# Patient Record
Sex: Female | Born: 1965 | Race: Black or African American | Marital: Single
Health system: Southern US, Community
[De-identification: ages and names within clinical notes are randomized; demographics above are authoritative.]

---

## 2013-11-07 ENCOUNTER — Other Ambulatory Visit: Payer: Self-pay | Admitting: Sports Medicine

## 2013-11-07 DIAGNOSIS — M25512 Pain in left shoulder: Secondary | ICD-10-CM

## 2013-11-12 ENCOUNTER — Ambulatory Visit
Admission: RE | Admit: 2013-11-12 | Discharge: 2013-11-12 | Disposition: A | Discharge status: Home / Self Care | Payer: Managed Care, Other (non HMO) | Source: Ambulatory Visit | Attending: Sports Medicine | Admitting: Sports Medicine

## 2013-11-12 DIAGNOSIS — M25512 Pain in left shoulder: Secondary | ICD-10-CM

## 2016-02-06 IMAGING — MR MR SHOULDER*L* W/O CM
5 series · 34 of 40 positions shown · non-contrast
Comparison: None.

CLINICAL DATA: Chronic left shoulder pain with very limited range
of motion. No recent injury or prior relevant surgery.

EXAM:
MRI OF THE LEFT SHOULDER WITHOUT CONTRAST
TECHNIQUE: Multiplanar, multisequence MR imaging of the shoulder was performed.
No intravenous contrast was administered.

[Series 3: T2 fat-sat · axial · 4.0mm · 0.55mm/px · z∈[-48,+36]mm · 8 of 20 slices shown (1 of 3)]
[im 1/20]
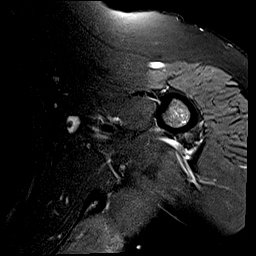
[im 3/20]
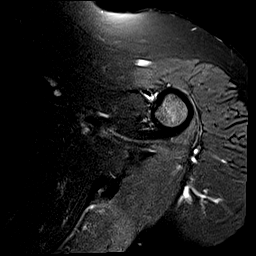
[im 6/20]
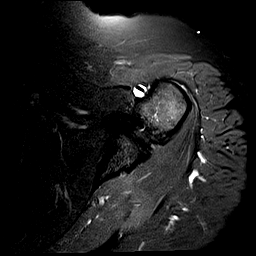
[im 9/20]
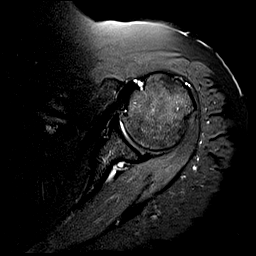
[im 11/20]
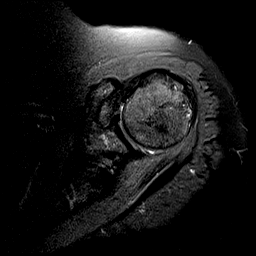
[im 14/20]
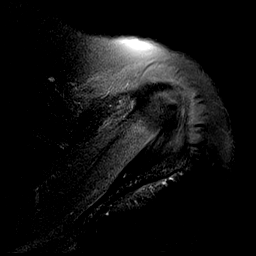
[im 17/20]
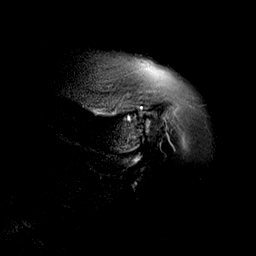
[im 20/20]
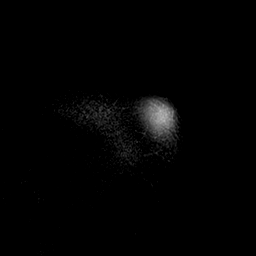

[Series 4: T2 fat-sat · oblique · 4.0mm · 0.55mm/px · 8 of 19 slices shown (2 of 3)]
[im 1/19]
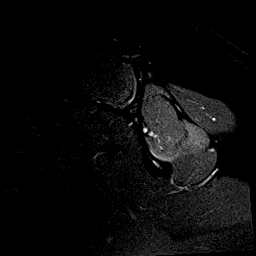
[im 3/19]
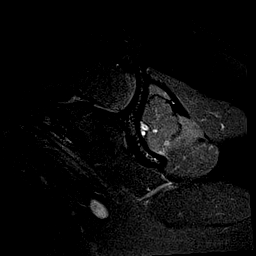
[im 6/19]
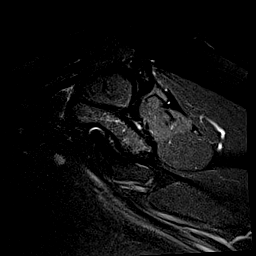
[im 8/19]
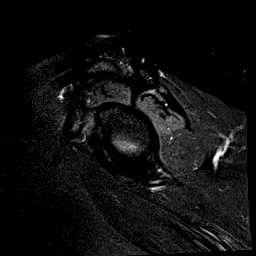
[im 11/19]
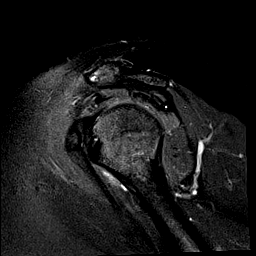
[im 13/19]
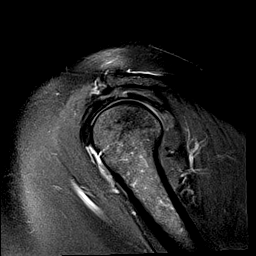
[im 16/19]
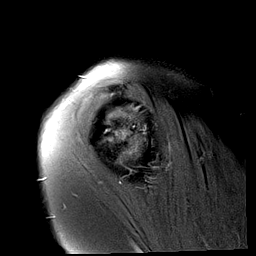
[im 19/19]
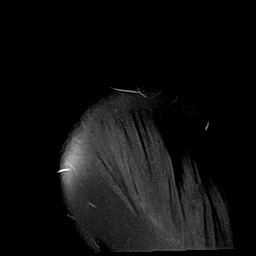

[Series 5: T2 fat-sat · oblique · 4.0mm · 0.27mm/px · 8 of 19 slices shown (3 of 3)]
[im 1/19]
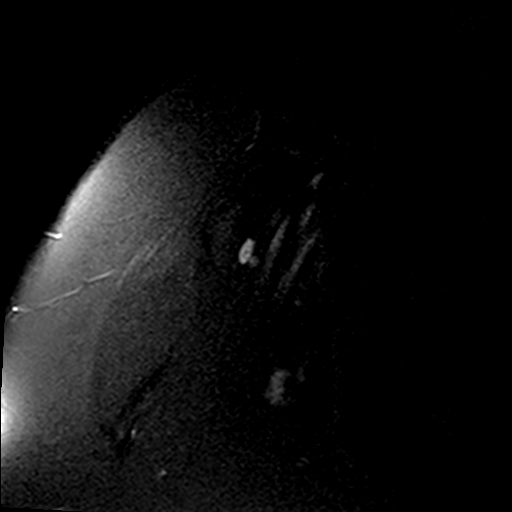
[im 3/19]
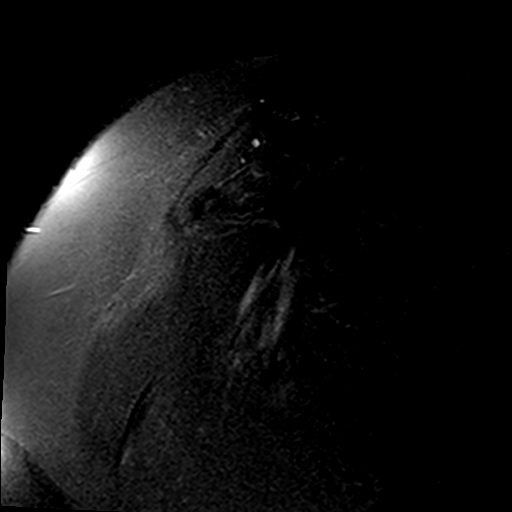
[im 6/19]
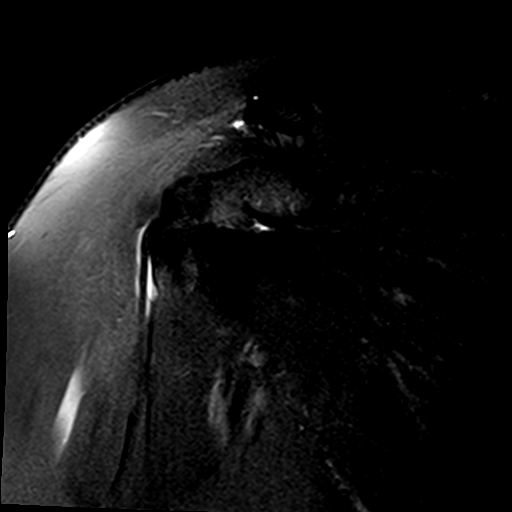
[im 8/19]
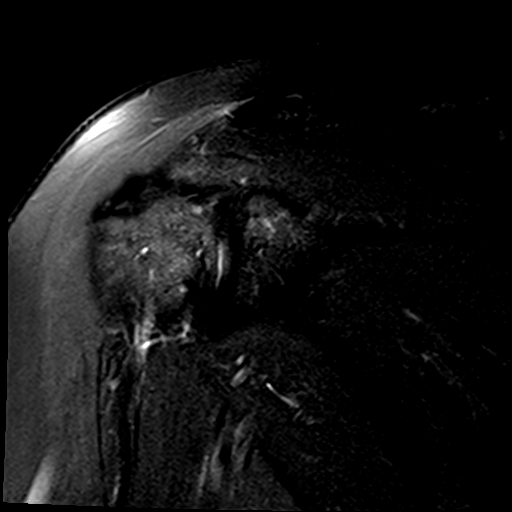
[im 11/19]
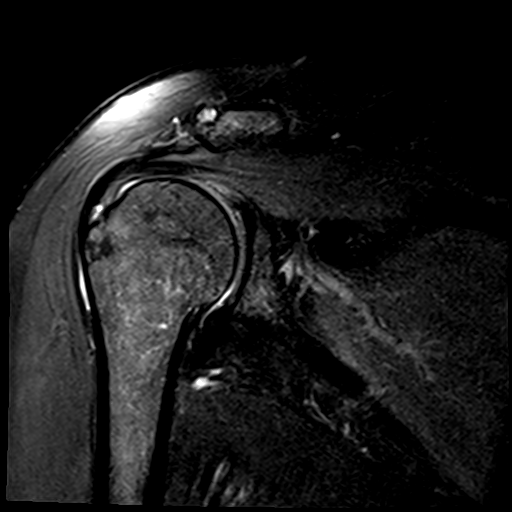
[im 13/19]
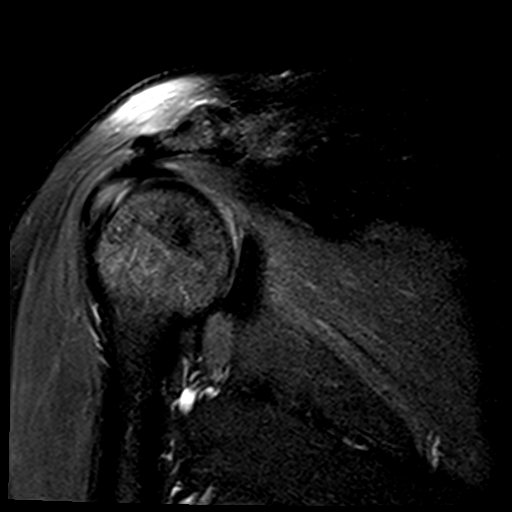
[im 16/19]
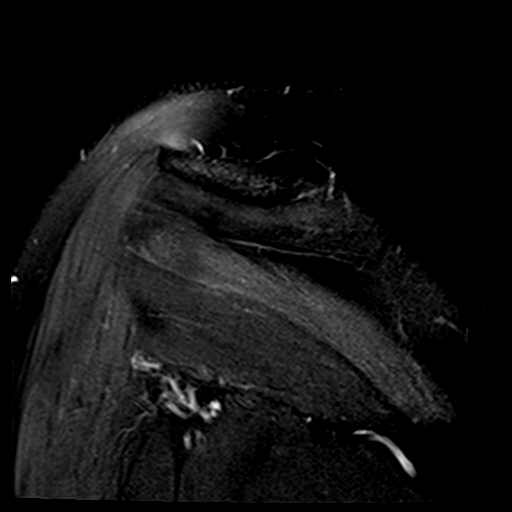
[im 19/19]
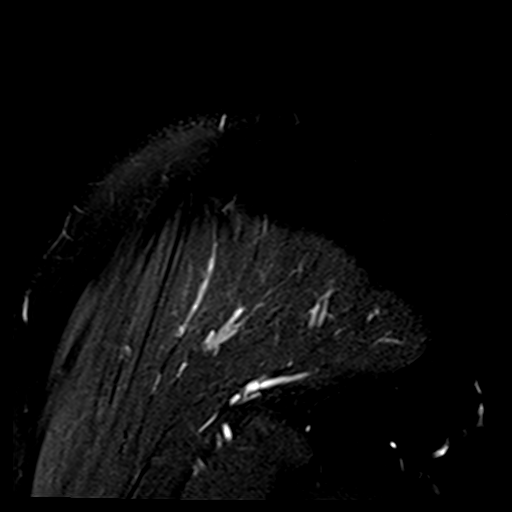

[Series 6: T1 · oblique · 4.0mm · 0.22mm/px · 2 of 19 slices shown]
[im 1/19]
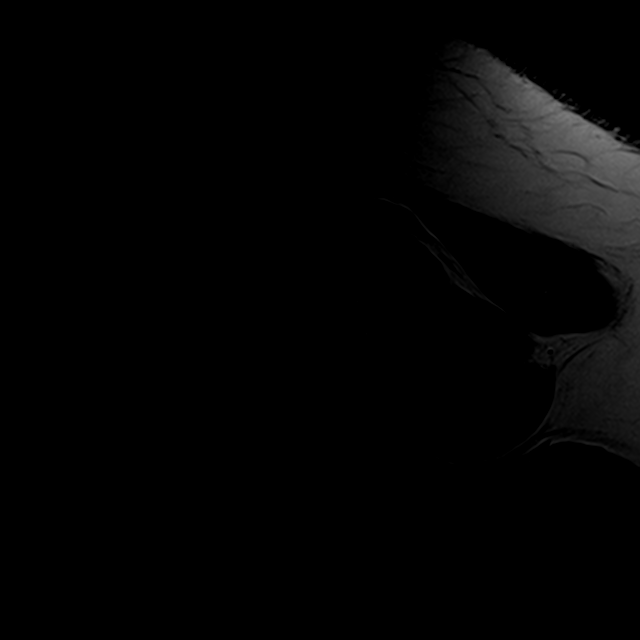
[im 3/19]
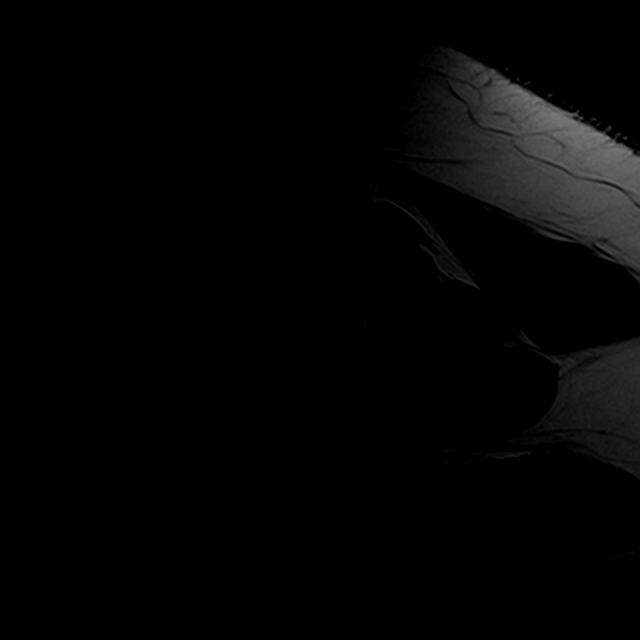

[Series 7: PD · oblique · 4.0mm · 0.44mm/px · 8 of 19 slices shown]
[im 1/19]
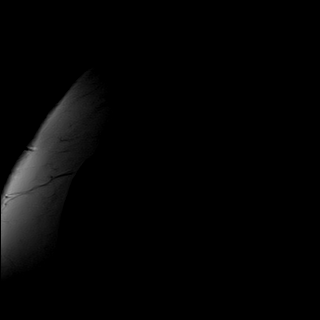
[im 3/19]
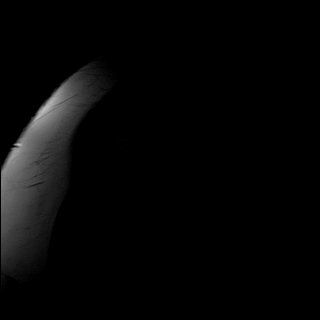
[im 6/19]
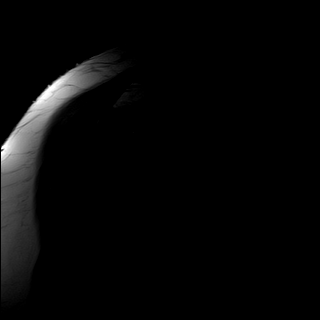
[im 8/19]
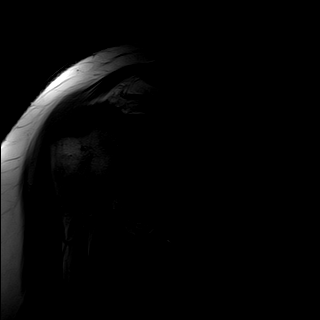
[im 11/19]
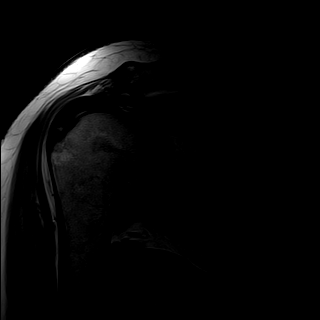
[im 13/19]
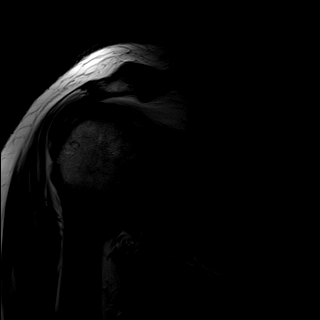
[im 16/19]
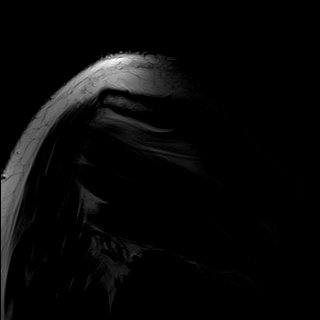
[im 19/19]
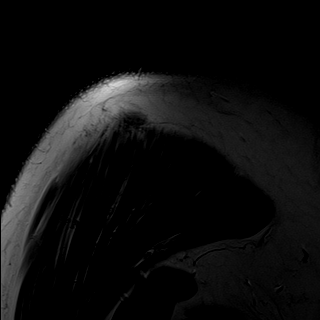

[34 of 40 positions shown; findings below may reference images not displayed]

FINDINGS: Rotator cuff: There is focal intrasubstance insertional tearing of
the supraspinatus tendon, best seen on coronal images 8-10. The
bursal and articular surface fibers appear intact. There is no
tendon retraction. Mild underlying supraspinatus and infraspinous
tendinosis is present. The subscapularis and teres minor tendons
appear normal.

Muscles:  No focal muscular atrophy or edema.

Biceps long head:  Intact and normally positioned.

Acromioclavicular Joint: The acromion is type 2. There are mild to
moderate acromioclavicular degenerative changes. No significant
fluid is present in the subacromial - subdeltoid bursa.

Glenohumeral Joint: No significant shoulder joint effusion or
glenohumeral arthropathy. No capsular abnormality apparent.

Labrum:  No evidence of labral tear.

Bones:  No significant extra-articular osseous findings.
IMPRESSION: 1. Intrasubstance insertional tearing of the supraspinatus tendon
(concealed interstitial delamination injury). No evidence of
full-thickness tear or tendon retraction.
2. Mild supraspinatus and infraspinous tendinosis.
3. Mild-to-moderate acromioclavicular degenerative changes.
4. No evidence of labral or biceps tendon tear.
